# Patient Record
Sex: Female | Born: 2000 | Race: Black or African American | Hispanic: No | Marital: Married | Smoking: Never smoker
Health system: Southern US, Community
[De-identification: ages and names within clinical notes are randomized; demographics above are authoritative.]

## PROBLEM LIST (undated history)

## (undated) DIAGNOSIS — R011 Cardiac murmur, unspecified: Secondary | ICD-10-CM

## (undated) HISTORY — PX: CARDIAC SURGERY: SHX584

---
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
## 2022-03-26 ENCOUNTER — Other Ambulatory Visit: Payer: Self-pay

## 2022-03-26 ENCOUNTER — Emergency Department (HOSPITAL_BASED_OUTPATIENT_CLINIC_OR_DEPARTMENT_OTHER)
Admission: EM | Admit: 2022-03-26 | Discharge: 2022-03-26 | Disposition: A | Discharge status: Home / Self Care | Payer: No Typology Code available for payment source | Attending: Emergency Medicine | Admitting: Emergency Medicine

## 2022-03-26 ENCOUNTER — Other Ambulatory Visit (HOSPITAL_BASED_OUTPATIENT_CLINIC_OR_DEPARTMENT_OTHER): Payer: Self-pay

## 2022-03-26 ENCOUNTER — Encounter (HOSPITAL_BASED_OUTPATIENT_CLINIC_OR_DEPARTMENT_OTHER): Payer: Self-pay | Admitting: Emergency Medicine

## 2022-03-26 DIAGNOSIS — K0889 Other specified disorders of teeth and supporting structures: Secondary | ICD-10-CM | POA: Insufficient documentation

## 2022-03-26 MED ORDER — AMOXICILLIN 500 MG PO CAPS
500.0000 mg | ORAL_CAPSULE | Freq: Three times a day (TID) | ORAL | 0 refills | Status: AC
  Filled 2022-03-26: qty 21, 7d supply, fill #0

## 2022-03-26 MED ORDER — NAPROXEN 500 MG PO TABS
500.0000 mg | ORAL_TABLET | Freq: Two times a day (BID) | ORAL | 0 refills | Status: AC
  Filled 2022-03-26: qty 30, 15d supply, fill #0

## 2022-03-26 NOTE — ED Notes (Signed)
Discharge paperwork reviewed entirely with patient, including Rx's and follow up care. Pain was under control. Pt verbalized understanding as well as all parties involved. No questions or concerns voiced at the time of discharge. No acute distress noted.   Pt ambulated out to PVA without incident or assistance.  

## 2022-03-26 NOTE — Discharge Instructions (Addendum)
Please take your antibiotics as prescribed. Take tylenol/ibuprofen or naproxen for pain.  Use ice packs for symptom relief. I recommend close follow-up with a dentist for reevaluation.  Please do not hesitate to return to emergency department if worrisome signs symptoms we discussed become apparent.

## 2022-03-26 NOTE — ED Provider Notes (Signed)
[place] [facility] Provider Note   CSN: [id number] Arrival date & time: 03/26/22  1012     History  Chief Complaint  Patient presents with   Dental Pain    Tonya Byrd is a 22 y.o. female otherwise healthy presents today for evaluation of toothache.  Patient states she has had pain in her right lower jaw around the molar tooth for 5 days.  States pain is around the gum and spreads to her right lower jaw.  Patient has been taking meloxicam and apply ice to the affected area with minimal improvement.  Patient just moved to the area and does not have a dentist.  Denies any fever.  Reports pain with eating and swallowing.   Dental Pain   Past Medical History:  Diagnosis Date   Murmur, cardiac    Past Surgical History:  Procedure Laterality Date   CARDIAC SURGERY       Home Medications Prior to Admission medications   Medication Sig Start Date End Date Taking? Authorizing Provider  amoxicillin (AMOXIL) 500 MG capsule Take 1 capsule (500 mg total) by mouth 3 (three) times daily. 03/26/22  Yes Rex Kras, PA  naproxen (NAPROSYN) 500 MG tablet Take 1 tablet (500 mg total) by mouth 2 (two) times daily. 03/26/22  Yes Rex Kras, PA  ondansetron (ZOFRAN) 4 MG tablet Take 1 tablet (4 mg total) by mouth every 6 (six) hours. 12/26/17   Couture, Cortni S, PA-C      Allergies    Shellfish allergy    Review of Systems   Review of Systems Negative except as per HPI.  Physical Exam Updated Vital Signs BP 124/81 (BP Location: Right Arm) Comment: Simultaneous filing. User may not have seen previous data. Comment (BP Location): Simultaneous filing. User may not have seen previous data.  Pulse 82 Comment: Simultaneous filing. User may not have seen previous data.  Temp 98.4 F (36.9 C) (Oral) Comment: Simultaneous filing. User may not have seen previous data. Comment (Src): Simultaneous filing. User may not have seen previous data.  Resp 20 Comment:  Simultaneous filing. User may not have seen previous data.  Ht 5\' 4"  (1.626 m)   Wt 71.7 kg   SpO2 100% Comment: Simultaneous filing. User may not have seen previous data.  BMI 27.12 kg/m  Physical Exam Vitals and nursing note reviewed.  Constitutional:      Appearance: Normal appearance.  HENT:     Head: Normocephalic and atraumatic.     Mouth/Throat:     Mouth: Mucous membranes are moist.     Comments: Moderate swelling and tenderness to palpation to gingiva of right lower 2nd molar. Eyes:     General: No scleral icterus. Cardiovascular:     Rate and Rhythm: Normal rate and regular rhythm.     Pulses: Normal pulses.     Heart sounds: Normal heart sounds.  Pulmonary:     Effort: Pulmonary effort is normal.     Breath sounds: Normal breath sounds.  Abdominal:     General: Abdomen is flat.     Palpations: Abdomen is soft.     Tenderness: There is no abdominal tenderness.  Musculoskeletal:        General: No deformity.  Skin:    General: Skin is warm.     Findings: No rash.  Neurological:     General: No focal deficit present.     Mental Status: She is alert.  Psychiatric:  Mood and Affect: Mood normal.     ED Results / Procedures / Treatments   Labs (all labs ordered are listed, but only abnormal results are displayed) Labs Reviewed - No data to display  EKG None  Radiology No results found.  Procedures Procedures    Medications Ordered in ED Medications - No data to display  ED Course/ Medical Decision Making/ A&P                             Medical Decision Making  This patient presents to the ED for toothache, this involves an extensive number of treatment options, and is a complaint that carries with a high risk of complications and morbidity.  The differential diagnosis includes tooth decay, gingivitis, periapical abscess, infectious etiology.  This is not an exhaustive list  Problem list/ ED course/ Critical interventions/ Medical  management: HPI: See above Vital signs within normal range and stable throughout visit. Laboratory/imaging studies significant for: See above. On physical examination, patient is afebrile and appears in no acute distress. Patient presents for dental pain due to suspected dental cary. Patient not immunosuppressed, afebrile and well appearing with patent airway, have low suspicfion for deep space infection or any concern for airway compromise. Based on history, physical, no evidence of tooth fracture, avulsion, or bleeding socket. No evidence of RPA, PTA, Ludwig's angina, periapical abscess. Instructed patient to continue to treat pain with ibuprofen/acetaminophen until they see a dentist.  Amoxicillin ordered.  Patient discharged home and will follow up with dentist. Discussed return precautions for odontogenic infections and other dental pain emergencies. Will provide dental clinic list. I have reviewed the patient home medicines and have made adjustments as needed.  Cardiac monitoring/EKG: The patient was maintained on a cardiac monitor.  I personally reviewed and interpreted the cardiac monitor which showed an underlying rhythm of: sinus rhythm.  Additional history obtained: External records from outside source obtained and reviewed including: Chart review including previous notes, labs, imaging.  Consultations obtained:  Disposition Continued outpatient therapy. Follow-up with dentist recommended for reevaluation of symptoms. Treatment plan discussed with patient.  Pt acknowledged understanding was agreeable to the plan. Worrisome signs and symptoms were discussed with patient, and patient acknowledged understanding to return to the ED if they noticed these signs and symptoms. Patient was stable upon discharge.   This chart was dictated using voice recognition software.  Despite best efforts to proofread,  errors can occur which can change the documentation meaning.          Final  Clinical Impression(s) / ED Diagnoses Final diagnoses:  Pain, dental    Rx / DC Orders ED Discharge Orders          Ordered    naproxen (NAPROSYN) 500 MG tablet  2 times daily        03/26/22 1113    amoxicillin (AMOXIL) 500 MG capsule  3 times daily        03/26/22 1113              Rex Kras, [place] 03/26/22 1117    Long, W[facility], MD 03/31/22 1046

## 2022-03-26 NOTE — ED Triage Notes (Signed)
Lower rt  tooth pain x 5 days. Has a bump and gums have been swollen

## 2022-05-30 ENCOUNTER — Other Ambulatory Visit: Payer: Self-pay | Admitting: Internal Medicine

## 2022-05-30 DIAGNOSIS — M5430 Sciatica, unspecified side: Secondary | ICD-10-CM

## 2022-05-30 DIAGNOSIS — M5431 Sciatica, right side: Secondary | ICD-10-CM

## 2022-06-09 ENCOUNTER — Ambulatory Visit
Admission: RE | Admit: 2022-06-09 | Discharge: 2022-06-09 | Disposition: A | Discharge status: Home / Self Care | Payer: No Typology Code available for payment source | Source: Ambulatory Visit | Attending: Internal Medicine | Admitting: Internal Medicine

## 2022-06-09 DIAGNOSIS — M5431 Sciatica, right side: Secondary | ICD-10-CM

## 2022-06-09 DIAGNOSIS — M5430 Sciatica, unspecified side: Secondary | ICD-10-CM

## 2022-09-05 ENCOUNTER — Emergency Department (HOSPITAL_BASED_OUTPATIENT_CLINIC_OR_DEPARTMENT_OTHER): Payer: No Typology Code available for payment source

## 2022-09-05 ENCOUNTER — Emergency Department (HOSPITAL_BASED_OUTPATIENT_CLINIC_OR_DEPARTMENT_OTHER)
Admission: EM | Admit: 2022-09-05 | Discharge: 2022-09-05 | Disposition: A | Discharge status: Home / Self Care | Payer: No Typology Code available for payment source | Attending: Emergency Medicine | Admitting: Emergency Medicine

## 2022-09-05 ENCOUNTER — Encounter (HOSPITAL_BASED_OUTPATIENT_CLINIC_OR_DEPARTMENT_OTHER): Payer: Self-pay | Admitting: Pediatrics

## 2022-09-05 ENCOUNTER — Other Ambulatory Visit: Payer: Self-pay

## 2022-09-05 DIAGNOSIS — M545 Low back pain, unspecified: Secondary | ICD-10-CM | POA: Diagnosis present

## 2022-09-05 DIAGNOSIS — M25552 Pain in left hip: Secondary | ICD-10-CM | POA: Insufficient documentation

## 2022-09-05 DIAGNOSIS — M25551 Pain in right hip: Secondary | ICD-10-CM

## 2022-09-05 LAB — PREGNANCY, URINE: Preg Test, Ur: NEGATIVE

## 2022-09-05 NOTE — ED Triage Notes (Signed)
C/O right sided gluteal / posterior thigh pain; endorsed hx of degenerative dx. States taken some ibuprofen with min relief.

## 2022-09-05 NOTE — ED Provider Notes (Signed)
[place] [facility] AT [facility] Provider Note   CSN: [id number] Arrival date & time: 09/05/22  1420     History  Chief Complaint  Patient presents with   Leg Pain    Champagne Faudree is a 22 y.o. female.  Patient with history of degenerative disc disease in the lower back, followed by a spine center, has received injections into her lower back presents the emergency department today for intense than her typical right-sided buttocks pain that radiates down into her leg.  She states that it feels similar to when she has had sciatica in the past.  She states that it feels that the bones and joint are grinding when she walks.  She has increased pain with that.  She is requesting an x-ray.  She has taken NSAIDs without improvement.  Right now she states she is just trying to "tough it out".  No acute falls or injuries.      Home Medications Prior to Admission medications   Medication Sig Start Date End Date Taking? Authorizing Provider  amoxicillin (AMOXIL) 500 MG capsule Take 1 capsule (500 mg total) by mouth 3 (three) times daily. 03/26/22   Jeanelle Malling, PA  naproxen (NAPROSYN) 500 MG tablet Take 1 tablet (500 mg total) by mouth 2 (two) times daily. 03/26/22   Jeanelle Malling, PA  ondansetron (ZOFRAN) 4 MG tablet Take 1 tablet (4 mg total) by mouth every 6 (six) hours. 12/26/17   Couture, Cortni S, PA-C      Allergies    Shellfish allergy    Review of Systems   Review of Systems  Physical Exam Updated Vital Signs BP (!) 111/90 (BP Location: Right Arm)   Pulse 66   Temp 98 F (36.7 C)   Resp 18   Ht 5\' 4"  (1.626 m)   Wt 70.3 kg   LMP 09/03/2022   SpO2 100%   BMI 26.61 kg/m   Physical Exam Vitals and nursing note reviewed.  Constitutional:      Appearance: She is well-developed.  HENT:     Head: Normocephalic and atraumatic.  Eyes:     Conjunctiva/sclera: Conjunctivae normal.  Pulmonary:     Effort: Pulmonary effort is normal.  Abdominal:      Palpations: Abdomen is soft.     Tenderness: There is no abdominal tenderness.  Musculoskeletal:        General: Normal range of motion.     Cervical back: Normal range of motion and neck supple. No tenderness or bony tenderness. Normal range of motion.     Thoracic back: No tenderness or bony tenderness. Normal range of motion.     Lumbar back: No tenderness or bony tenderness. Normal range of motion.     Right hip: Tenderness present. No bony tenderness. Normal range of motion.     Right knee: Normal range of motion. No tenderness.     Comments: No step-off noted with palpation of spine.  Patient is able to stand from a sitting position and ambulate without difficulty.  Skin:    General: Skin is warm and dry.     Findings: No rash.  Neurological:     Mental Status: She is alert.     Sensory: No sensory deficit.     Comments: 5/5 strength in entire lower extremities bilaterally. No sensation deficit.     ED Results / Procedures / Treatments   Labs (all labs ordered are listed, but only abnormal results are displayed) Labs Reviewed  PREGNANCY, URINE    EKG None  Radiology No results found.  Procedures Procedures    Medications Ordered in ED Medications - No data to display  ED Course/ Medical Decision Making/ A&P    Patient seen and examined. History obtained directly from patient.   Labs/EKG: Pregnancy  Imaging: X-ray of the right hip  Medications/Fluids: None ordered  Most recent vital signs reviewed and are as follows: BP 115/87 (BP Location: Left Arm)   Pulse 61   Temp 98.2 F (36.8 C) (Oral)   Resp 17   Ht 5\' 4"  (1.626 m)   Wt 70.3 kg   LMP 09/03/2022   SpO2 100%   BMI 26.61 kg/m   Initial impression: Hip pain in setting of chronic back issues  7:11 PM Reassessment performed. Patient appears stable.  Labs personally reviewed and interpreted including: Pregnancy negative  Imaging personally visualized and interpreted including: X-ray, I do not  see any acute problems.  Awaiting radiology read.  Reviewed pertinent lab work and imaging with patient at bedside. Questions answered.   Most current vital signs reviewed and are as follows: BP 115/87 (BP Location: Left Arm)   Pulse 61   Temp 98.2 F (36.8 C) (Oral)   Resp 17   Ht 5\' 4"  (1.626 m)   Wt 70.3 kg   LMP 09/03/2022   SpO2 100%   BMI 26.61 kg/m   Plan: Discharge to home.   Prescriptions written for: None, patient comfortable taking over-the-counter anti-inflammatories  Other home care instructions discussed: Rest as able  ED return instructions discussed: New or worsening symptoms  Follow-up instructions discussed: Patient encouraged to follow-up with their spine doctor/orthopedist as planned.                                    Medical Decision Making Amount and/or Complexity of Data Reviewed Labs: ordered. Radiology: ordered.   Patient with hip pain, likely musculoskeletal.  Possibly radicular, but given that which she typically feels.  No red flags otherwise.  X-ray without any concerning findings.  She will continue basic care and follow-up with her orthopedist.        Final Clinical Impression(s) / ED Diagnoses Final diagnoses:  Pain of right hip    Rx / DC Orders ED Discharge Orders     None         Renne Crigler, PA-C 09/05/22 1912    Virgina Norfolk, DO 09/05/22 1923

## 2022-09-05 NOTE — Discharge Instructions (Signed)
Your x-ray did not show any significant findings.  I will notify you, as we discussed, if the radiologist sees any major problems.  Continue over-the-counter anti-inflammatories and follow-up with your spine doctor as planned.  Return the emergency department with new or worsening symptoms.
